# Patient Record
Sex: Female | Born: 2000 | Race: White | Hispanic: No | Marital: Single
Health system: Southern US, Community
[De-identification: ages and names within clinical notes are randomized; demographics above are authoritative.]

## PROBLEM LIST (undated history)

## (undated) VITALS — BP 123/67 | HR 69 | Temp 98.2°F | Resp 17 | Ht 69.0 in | Wt 175.0 lb

## (undated) DIAGNOSIS — F329 Major depressive disorder, single episode, unspecified: Secondary | ICD-10-CM

## (undated) DIAGNOSIS — F909 Attention-deficit hyperactivity disorder, unspecified type: Secondary | ICD-10-CM

## (undated) DIAGNOSIS — F32A Depression, unspecified: Secondary | ICD-10-CM

## (undated) DIAGNOSIS — F913 Oppositional defiant disorder: Secondary | ICD-10-CM

## (undated) DIAGNOSIS — Z9622 Myringotomy tube(s) status: Secondary | ICD-10-CM

## (undated) HISTORY — PX: MYRINGOTOMY: SUR874

---
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
## 2022-05-03 ENCOUNTER — Ambulatory Visit (HOSPITAL_COMMUNITY): Admission: AD | Admit: 2022-05-03 | Payer: Self-pay | Source: Home / Self Care

## 2022-05-03 ENCOUNTER — Encounter (HOSPITAL_COMMUNITY): Payer: Self-pay | Admitting: Psychiatry

## 2022-05-03 ENCOUNTER — Other Ambulatory Visit: Payer: Self-pay

## 2022-05-03 ENCOUNTER — Inpatient Hospital Stay (HOSPITAL_COMMUNITY)
Admission: AD | Admit: 2022-05-03 | Discharge: 2022-05-08 | DRG: 885 | Disposition: A | Discharge status: Home / Self Care | Payer: Medicaid Other | Attending: Psychiatry | Admitting: Psychiatry

## 2022-05-03 DIAGNOSIS — Z9152 Personal history of nonsuicidal self-harm: Secondary | ICD-10-CM | POA: Diagnosis not present

## 2022-05-03 DIAGNOSIS — F909 Attention-deficit hyperactivity disorder, unspecified type: Secondary | ICD-10-CM | POA: Diagnosis present

## 2022-05-03 DIAGNOSIS — R45851 Suicidal ideations: Secondary | ICD-10-CM | POA: Diagnosis present

## 2022-05-03 DIAGNOSIS — J45909 Unspecified asthma, uncomplicated: Secondary | ICD-10-CM | POA: Diagnosis present

## 2022-05-03 DIAGNOSIS — F419 Anxiety disorder, unspecified: Secondary | ICD-10-CM

## 2022-05-03 DIAGNOSIS — Z20822 Contact with and (suspected) exposure to covid-19: Secondary | ICD-10-CM | POA: Diagnosis present

## 2022-05-03 DIAGNOSIS — F32A Depression, unspecified: Secondary | ICD-10-CM

## 2022-05-03 DIAGNOSIS — F333 Major depressive disorder, recurrent, severe with psychotic symptoms: Principal | ICD-10-CM

## 2022-05-03 DIAGNOSIS — F329 Major depressive disorder, single episode, unspecified: Secondary | ICD-10-CM | POA: Insufficient documentation

## 2022-05-03 DIAGNOSIS — G4700 Insomnia, unspecified: Secondary | ICD-10-CM | POA: Diagnosis present

## 2022-05-03 LAB — SARS CORONAVIRUS 2 BY RT PCR: SARS Coronavirus 2 by RT PCR: NEGATIVE

## 2022-05-03 MED ORDER — ALUM & MAG HYDROXIDE-SIMETH 200-200-20 MG/5ML PO SUSP: 30.0000 mL | ORAL | Status: DC | PRN

## 2022-05-03 MED ORDER — ACETAMINOPHEN 325 MG PO TABS: 650.0000 mg | ORAL_TABLET | Freq: Four times a day (QID) | ORAL | Status: DC | PRN

## 2022-05-03 MED ORDER — MAGNESIUM HYDROXIDE 400 MG/5ML PO SUSP: 30.0000 mL | Freq: Every day | ORAL | Status: DC | PRN

## 2022-05-03 MED ORDER — HYDROXYZINE HCL 25 MG PO TABS
25.0000 mg | ORAL_TABLET | Freq: Three times a day (TID) | ORAL | Status: DC | PRN
  Administered 2022-05-07: 25 mg via ORAL
  Filled 2022-05-03: qty 1
  Filled 2022-05-03: qty 10
  Filled 2022-05-03: qty 1

## 2022-05-03 NOTE — Tx Team (Signed)
Initial Treatment Plan 05/03/2022 5:09 PM ROYALE ALFRED [id number]    PATIENT STRESSORS: Educational concerns   Financial difficulties   Health problems     PATIENT STRENGTHS: Hydrographic surveyor for treatment/growth  Supportive family/friends    PATIENT IDENTIFIED PROBLEMS: Suicidal thoughts  Depression  Anxiety  Ineffective coping skills               DISCHARGE CRITERIA:  Adequate post-discharge living arrangements Motivation to continue treatment in a less acute level of care  PRELIMINARY DISCHARGE PLAN: Attend aftercare/continuing care group Attend PHP/IOP Outpatient therapy Return to previous living arrangement  PATIENT/FAMILY INVOLVEMENT: This treatment plan has been presented to and reviewed with the patient, Terry Keith, and/or family member.  The patient and family have been given the opportunity to ask questions and make suggestions.  Vela Prose, RN 05/03/2022, 5:09 PM

## 2022-05-03 NOTE — H&P (Signed)
[place] Screening Exam  Terry Keith is a 21 y.o. female who presents voluntarily as a walk-in accompanied by her mother [name] to [facility] for worsening suicidal thoughts. Pt has past psychiatric hx of ADHD, Suicide ideation, Homicidal ideation, Major depression, and anxiety. She is currently attending [employer] [facility] [employer], Adult High School Diploma Program. 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  On assessment today, patient is examined sitting up on a chair in the Screen room. Chart reviewed and findings shared with the tx team and discussed with Dr. Dwyane Dee. Alert and oriented x 4 to person, time, place and situation. Speech fluent, clear with normal pattern but with low volume. Appeared depressed, but able to maintains eye contact during the encounter. Thought process coherent and linear. Thought content within normal limit and logical.  Reported depressed and anxious mood, appropriate and congruent affect.  Reported Suicidal thoughts has been persistent for the past 3 months with plans to OD on medications and to drink chloride bleach. Reported no particular trigger. Reported anxiety of 10/10 with panic attacks on a scale of 0 to 10, 10 being the highest. Reported symptoms of depression as self isolation, crying spells, irritability, hopelessness, worthlessness, guilt, poor concentration, and anhedonia. Reported hx of cutting at age 20 years and that was the first/last time she cut herself and had to get some help. Endorsed SI, and AVH, and failure to take her prescribed medication of Escitalopram for fear of impulsive OD. Endorsed sleeping up to 7 to 8 hours last night and decrease appetite. Denied  HI, paranoia, delusional or history of suicidal attempt.  Denied access to firearms, alcohol, drug or tobacco use or dependence. Reported not being followed by a Therapist or Psychiatrist.  Disposition: Based on my assessment, I recommend  psychiatric Inpatient admission to Adult Unit at [facility]. Covid 19 and admission orders initiated. [facility] Treatment team made aware of patient's disposition.  Total Time spent with patient: 1 hour  Psychiatric Specialty Exam:  Presentation  General Appearance: Appropriate for Environment; Casual; Fairly Groomed  Eye Contact:Fair  Speech:Clear and Coherent  Speech Volume:Normal  Handedness:Right  Mood and Affect  Mood:Anxious; Depressed  Affect:Appropriate; Congruent  Thought Process  Thought Processes:Coherent; Linear  Descriptions of Associations:Intact  Orientation:Full (Time, Place and Person)  Thought Content:Logical; WDL  History of Schizophrenia/Schizoaffective disorder:No data recorded Duration of Psychotic Symptoms:No data recorded Hallucinations:Hallucinations: Auditory; Visual Description of Auditory Hallucinations: Hears non familiar voices that she is useless and worthless Description of Visual Hallucinations: Sees shadows at the corner of her eyes.  Ideas of Reference:None  Suicidal Thoughts:Suicidal Thoughts: Yes, Active SI Active Intent and/or Plan: With Intent; With Plan; With Means to [name]  Homicidal Thoughts:Homicidal Thoughts: No  Sensorium  Memory:Immediate Fair; Recent Fair; Remote Fair  Judgment:Fair  Insight:Fair  Executive Functions  Concentration:Fair  Attention Span:Fair  [place]  Language:Good  Psychomotor Activity  Psychomotor Activity:Psychomotor Activity: Normal  Assets  Assets:Communication Skills; Social Support; Physical Health  Sleep  Sleep:Sleep: Good Number of Hours of Sleep: 8  Physical Exam: Physical Exam Vitals and nursing note reviewed.  Constitutional:      Appearance: Normal appearance.  HENT:     Head: Normocephalic and atraumatic.     Right Ear: External ear normal.     Left Ear: External ear normal.     Nose: Nose normal.     Mouth/Throat:     Mouth: Mucous  membranes are moist.  Pharynx: Oropharynx is clear.  Eyes:     Extraocular Movements: Extraocular movements intact.     Conjunctiva/sclera: Conjunctivae normal.     Pupils: Pupils are equal, round, and reactive to light.  Cardiovascular:     Rate and Rhythm: Normal rate.     Pulses: Normal pulses.  Pulmonary:     Effort: Pulmonary effort is normal.  Abdominal:     Palpations: Abdomen is soft.  Genitourinary:    Comments: Deferred Musculoskeletal:        General: Normal range of motion.     Cervical back: Normal range of motion.  Skin:    General: Skin is warm.  Neurological:     General: No focal deficit present.     Mental Status: She is alert and oriented to person, place, and time.  Psychiatric:        Behavior: Behavior normal.   Review of Systems  Constitutional: Negative.  Negative for chills and fever.  HENT: Negative.  Negative for hearing loss and tinnitus.   Eyes: Negative.  Negative for blurred vision and double vision.  Respiratory: Negative.  Negative for cough, sputum production, shortness of breath and wheezing.   Cardiovascular: Negative.  Negative for chest pain and palpitations.  Gastrointestinal: Negative.  Negative for heartburn and nausea.  Genitourinary: Negative.  Negative for dysuria and urgency.  Musculoskeletal: Negative.  Negative for back pain, falls, joint pain, myalgias and neck pain.  Skin: Negative.  Negative for itching and rash.  Neurological: Negative.  Negative for dizziness, tingling, tremors, sensory change, speech change, focal weakness, seizures, loss of consciousness, weakness and headaches.  Endo/Heme/Allergies: Negative.  Negative for environmental allergies and polydipsia. Does not bruise/bleed easily.  Psychiatric/Behavioral:  Positive for depression, hallucinations and suicidal ideas. The patient is nervous/anxious.   Blood pressure (!) 158/83, pulse 71, temperature 98.8 F (37.1 C), temperature source Oral, resp. rate 18. There  is no height or weight on file to calculate BMI.  Musculoskeletal: Strength & Muscle Tone: within normal limits Gait & Station: normal Patient leans: N/A  Recommendations:  Based on my evaluation the patient appears to have an emergency psychiatric condition for which I recommend the patient be admitted  as an in-patient to [facility].  Laretta Bolster, FNP 05/03/2022, 1:27 PM
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
## 2022-05-03 NOTE — Progress Notes (Signed)
Admission Note: Patient is a 21 year old female who presents as a walk-in accompanied by her mother for symptoms of depression, anxiety and suicidal ideation with no plan.  Patient presents with a depressed mood and flat affect.  Patient is alert and oriented x 4.  Stated goal is to learn coping skills.  Admission plan of care reviewed, consent for treatment signed.  Skin assessment completed.  Acne noted all over face and body.  No contraband found.  Patient oriented to the unit, staff and room.  Routine safety checks initiated.  Verbalizes understanding of unit rules/protocols.  Patient is safe on the unit.

## 2022-05-04 ENCOUNTER — Encounter (HOSPITAL_COMMUNITY): Payer: Self-pay

## 2022-05-04 DIAGNOSIS — F333 Major depressive disorder, recurrent, severe with psychotic symptoms: Secondary | ICD-10-CM

## 2022-05-04 LAB — COMPREHENSIVE METABOLIC PANEL
ALT: 14 U/L (ref 0–44)
AST: 16 U/L (ref 15–41)
Albumin: 4.2 g/dL (ref 3.5–5.0)
Alkaline Phosphatase: 55 U/L (ref 38–126)
Anion gap: 6 (ref 5–15)
BUN: 15 mg/dL (ref 6–20)
CO2: 27 mmol/L (ref 22–32)
Calcium: 9.2 mg/dL (ref 8.9–10.3)
Chloride: 107 mmol/L (ref 98–111)
Creatinine, Ser: 0.6 mg/dL (ref 0.44–1.00)
GFR, Estimated: >60 mL/min (ref >60)
Glucose, Bld: 93 mg/dL (ref 70–99)
Potassium: 3.5 mmol/L (ref 3.5–5.1)
Sodium: 140 mmol/L (ref 135–145)
Total Bilirubin: 0.8 mg/dL (ref 0.3–1.2)
Total Protein: 7.4 g/dL (ref 6.5–8.1)

## 2022-05-04 LAB — CBC
HCT: 40 % (ref 36.0–46.0)
Hemoglobin: 13.3 g/dL (ref 12.0–15.0)
MCH: 30.7 pg (ref 26.0–34.0)
MCHC: 33.3 g/dL (ref 30.0–36.0)
MCV: 92.4 fL (ref 80.0–100.0)
Platelets: 348 10*3/uL (ref 150–400)
RBC: 4.33 MIL/uL (ref 3.87–5.11)
RDW: 12.3 % (ref 11.5–15.5)
WBC: 5.6 10*3/uL (ref 4.0–10.5)
nRBC: 0 % (ref 0.0–0.2)

## 2022-05-04 LAB — TSH: TSH: 1.148 u[IU]/mL (ref 0.350–4.500)

## 2022-05-04 LAB — HEMOGLOBIN A1C
Hgb A1c MFr Bld: 5 % (ref 4.8–5.6)
Mean Plasma Glucose: 96.8 mg/dL

## 2022-05-04 MED ORDER — QUETIAPINE FUMARATE 50 MG PO TABS
50.0000 mg | ORAL_TABLET | Freq: Every day | ORAL | Status: DC
  Administered 2022-05-04 – 2022-05-07 (×4): 50 mg via ORAL
  Filled 2022-05-04: qty 7
  Filled 2022-05-04 (×5): qty 1

## 2022-05-04 MED ORDER — FLUOXETINE HCL 10 MG PO CAPS
10.0000 mg | ORAL_CAPSULE | Freq: Every day | ORAL | Status: DC
  Administered 2022-05-04 – 2022-05-08 (×5): 10 mg via ORAL
  Filled 2022-05-04 (×4): qty 1
  Filled 2022-05-04: qty 7
  Filled 2022-05-04 (×3): qty 1

## 2022-05-04 NOTE — Progress Notes (Signed)
   05/04/22 0800  Psych Admission Type (Psych Patients Only)  Admission Status Voluntary  Psychosocial Assessment  Patient Complaints Depression;Anxiety  Eye Contact Brief  Facial Expression Sad  Affect Depressed  Speech Logical/coherent  Interaction Guarded  Motor Activity Slow  Appearance/Hygiene Unremarkable  Behavior Characteristics Appropriate to situation  Mood Depressed  Thought Process  Coherency WDL  Content WDL  Delusions None reported or observed  Perception WDL  Hallucination None reported or observed  Judgment WDL  Confusion None  Danger to Self  Current suicidal ideation? Denies  Danger to Others  Danger to Others None reported or observed   Pt presents with flat affect and she is guarded on approach.  Pt denies SI/HI/AVH.  Pt took medications without incident and no adverse reactions were noted.  RN will continue to monitor pt's progress and provide support as needed.
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
## 2022-05-04 NOTE — Progress Notes (Signed)
   05/03/22 2100  Psych Admission Type (Psych Patients Only)  Admission Status Voluntary  Psychosocial Assessment  Patient Complaints Anxiety  Eye Contact Fair  Facial Expression Sad;Flat  Affect Depressed  Speech Logical/coherent  Interaction Forwards little  Motor Activity Slow  Appearance/Hygiene Unremarkable  Behavior Characteristics Appropriate to situation;Cooperative  Mood Depressed  Thought Process  Coherency WDL  Content WDL  Delusions None reported or observed  Perception WDL  Hallucination None reported or observed  Judgment WDL  Confusion None  Danger to Self  Current suicidal ideation? Denies  Danger to Others  Danger to Others None reported or observed
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
## 2022-05-04 NOTE — Group Note (Addendum)
[facility] LCSW Group Therapy Note   Group Date: 05/04/2022 Start Time: 1300 End Time: 1400  Type of Therapy and Topic:  Group Therapy:  Feelings around Relapse and Recovery  Participation Level:  Minimal   Mood: Euthymic   Description of Group:    Patients in this group will discuss emotions they experience before and after a relapse. They will process how experiencing these feelings, or avoidance of experiencing them, relates to having a relapse. Facilitator will guide patients to explore emotions they have related to recovery. Patients will be encouraged to process which emotions are more powerful. They will be guided to discuss the emotional reaction significant others in their lives may have to patients' relapse or recovery. Patients will be assisted in exploring ways to respond to the emotions of others without this contributing to a relapse.  Therapeutic Goals: Patient will identify two or more emotions that lead to relapse for them:  Patient will identify two emotions that result when they relapse:  Patient will identify two emotions related to recovery:  Patient will demonstrate ability to communicate their needs through discussion and/or role plays.   Summary of Patient Progress:  Patient was present for the entirety of group session. Patient participated in opening and closing remarks. However, patient did not contribute at all to the topic of discussion despite encouraged participation.    Therapeutic Modalities:   Cognitive Behavioral Therapy Solution-Focused Therapy Assertiveness Training Relapse Prevention Therapy   Corky Crafts, [place]

## 2022-05-04 NOTE — BH IP Treatment Plan (Signed)
Interdisciplinary Treatment and Diagnostic Plan Update  05/04/2022 Time of Session: 9:30am Terry Keith MRN: [id number]  Principal Diagnosis: <principal problem not specified>  Secondary Diagnoses: Active Problems:   * No active hospital problems. *   Current Medications:  Current Facility-Administered Medications  Medication Dose Route Frequency Provider Last Rate Last Admin   acetaminophen (TYLENOL) tablet 650 mg  650 mg Oral Q6H PRN Ntuen, Kris Hartmann, FNP       alum & mag hydroxide-simeth (MAALOX/MYLANTA) 200-200-20 MG/5ML suspension 30 mL  30 mL Oral Q4H PRN Ntuen, Kris Hartmann, FNP       FLUoxetine (PROZAC) capsule 10 mg  10 mg Oral Daily Pashayan, Redgie Grayer, MD       hydrOXYzine (ATARAX) tablet 25 mg  25 mg Oral TID PRN Ntuen, Kris Hartmann, FNP       magnesium hydroxide (MILK OF MAGNESIA) suspension 30 mL  30 mL Oral Daily PRN Ntuen, Kris Hartmann, FNP       QUEtiapine (SEROQUEL) tablet 50 mg  50 mg Oral QHS Pashayan, Redgie Grayer, MD       PTA Medications: Medications Prior to Admission  Medication Sig Dispense Refill Last Dose   escitalopram (LEXAPRO) 10 MG tablet Take 10 mg by mouth daily.   05/02/2022   albuterol (PROVENTIL HFA;VENTOLIN HFA) 108 (90 BASE) MCG/ACT inhaler Inhale 2 puffs into the lungs every 4 (four) hours as needed for wheezing or shortness of breath.       Patient Stressors: Educational Stage manager difficulties   Health problems    Patient Strengths: Hydrographic surveyor for treatment/growth  Supportive family/friends   Treatment Modalities: Medication Management, Group therapy, Case management,  1 to 1 session with clinician, Psychoeducation, Recreational therapy.   Physician Treatment Plan for Primary Diagnosis: <principal problem not specified> Long Term Goal(s):     Short Term Goals:    Medication Management: Evaluate patient's response, side effects, and tolerance of medication regimen.  Therapeutic Interventions: 1 to 1 sessions, Unit  Group sessions and Medication administration.  Evaluation of Outcomes: Not Met  Physician Treatment Plan for Secondary Diagnosis: Active Problems:   * No active hospital problems. *  Long Term Goal(s):     Short Term Goals:       Medication Management: Evaluate patient's response, side effects, and tolerance of medication regimen.  Therapeutic Interventions: 1 to 1 sessions, Unit Group sessions and Medication administration.  Evaluation of Outcomes: Not Met   RN Treatment Plan for Primary Diagnosis: <principal problem not specified> Long Term Goal(s): Knowledge of disease and therapeutic regimen to maintain health will improve  Short Term Goals: Ability to remain free from injury will improve, Ability to verbalize frustration and anger appropriately will improve, Ability to demonstrate self-control, Ability to participate in decision making will improve, Ability to verbalize feelings will improve, Ability to disclose and discuss suicidal ideas, Ability to identify and develop effective coping behaviors will improve, and Compliance with prescribed medications will improve  Medication Management: RN will administer medications as ordered by provider, will assess and evaluate patient's response and provide education to patient for prescribed medication. RN will report any adverse and/or side effects to prescribing provider.  Therapeutic Interventions: 1 on 1 counseling sessions, Psychoeducation, Medication administration, Evaluate responses to treatment, Monitor vital signs and CBGs as ordered, Perform/monitor CIWA, COWS, AIMS and Fall Risk screenings as ordered, Perform wound care treatments as ordered.  Evaluation of Outcomes: Not Met   LCSW Treatment Plan for Primary Diagnosis: <principal problem  not specified> Long Term Goal(s): Safe transition to appropriate next level of care at discharge, Engage patient in therapeutic group addressing interpersonal concerns.  Short Term Goals:  Engage patient in aftercare planning with referrals and resources, Increase social support, Increase ability to appropriately verbalize feelings, Increase emotional regulation, Facilitate acceptance of mental health diagnosis and concerns, Facilitate patient progression through stages of change regarding substance use diagnoses and concerns, Identify triggers associated with mental health/substance abuse issues, and Increase skills for wellness and recovery  Therapeutic Interventions: Assess for all discharge needs, 1 to 1 time with Social worker, Explore available resources and support systems, Assess for adequacy in community support network, Educate family and significant other(s) on suicide prevention, Complete Psychosocial Assessment, Interpersonal group therapy.  Evaluation of Outcomes: Not Met   Progress in Treatment: Attending groups: No. Participating in groups: No. Taking medication as prescribed: Yes. Toleration medication: Yes. Family/Significant other contact made: No, will contact:  CSW will assess and identify a support Patient understands diagnosis: Yes. Discussing patient identified problems/goals with staff: Yes. Medical problems stabilized or resolved: Yes. Denies suicidal/homicidal ideation: Yes. Issues/concerns per patient self-inventory: No. Other: None  New problem(s) identified: No, Describe:  none  New Short Term/Long Term Goal(s):   medication stabilization, elimination of SI thoughts, development of comprehensive mental wellness plan.    Patient Goals:  Patient states, " I want to get to a point where I don't have suicidal thoughts or hear voices."  Discharge Plan or Barriers:  Patient recently admitted. CSW will continue to follow and assess for appropriate referrals and possible discharge planning.    Reason for Continuation of Hospitalization: Depression Hallucinations Medication stabilization Suicidal ideation  Estimated Length of Stay: 3-5  days  Last 3 [place] Suicide Severity Risk Score: Red Level Admission (Current) from OP Visit from 05/03/2022 in [facility]  C-SSRS RISK CATEGORY Low Risk       Last PHQ 2/9 Scores:     View : No data to display.          Scribe for Treatment Team: Zachery Conch, LCSW 05/04/2022 10:45 AM

## 2022-05-04 NOTE — Progress Notes (Signed)
Adult Psychoeducational Group Note  Date:  05/04/2022 Time:  8:30 PM  Group Topic/Focus:  Wrap-Up Group:   The focus of this group is to help patients review their daily goal of treatment and discuss progress on daily workbooks.  Participation Level:  Did Not Attend  Participation Quality:  Did Not Attend  Affect:  Did Not Attend  Cognitive:  Did Not Attend  Insight: Did Not Attend  Engagement in Group:  Did Not Attend  Modes of Intervention:  Did Not Attend  Additional Comments:   Pt was encouraged to attend wrap up group but refused   Vevelyn Pat 05/04/2022, 8:30 PM

## 2022-05-04 NOTE — Progress Notes (Signed)
   05/04/22 2130  Psych Admission Type (Psych Patients Only)  Admission Status Voluntary  Psychosocial Assessment  Patient Complaints Depression  Eye Contact Brief  Facial Expression Flat  Affect Appropriate to circumstance  Speech Logical/coherent  Interaction Cautious  Motor Activity Other (Comment) (WDL)  Appearance/Hygiene Unremarkable  Behavior Characteristics Appropriate to situation  Mood Depressed  Thought Process  Coherency WDL  Content WDL  Delusions None reported or observed  Perception WDL  Hallucination None reported or observed  Judgment Poor  Confusion None  Danger to Self  Current suicidal ideation? Denies  Danger to Others  Danger to Others None reported or observed
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
## 2022-05-05 LAB — LIPID PANEL
Cholesterol: 145 mg/dL (ref 0–200)
HDL: 44 mg/dL (ref >40)
LDL Cholesterol: 90 mg/dL (ref 0–99)
Total CHOL/HDL Ratio: 3.3 RATIO
Triglycerides: 55 mg/dL (ref <150)
VLDL: 11 mg/dL (ref 0–40)

## 2022-05-05 LAB — PROLACTIN: Prolactin: 28.5 ng/mL — ABNORMAL HIGH (ref 4.8–23.3)

## 2022-05-05 NOTE — Progress Notes (Signed)
Adult Psychoeducational Group Note  Date:  05/05/2022 Time:  7:47 PM  Group Topic/Focus:  Wrap-Up Group:   The focus of this group is to help patients review their daily goal of treatment and discuss progress on daily workbooks.  Participation Level:  Active  Participation Quality:  Appropriate  Affect:  Appropriate  Cognitive:  Appropriate  Insight: Appropriate  Engagement in Group:  Engaged  Modes of Intervention:  Discussion  Additional Comments:  Pt states goal is to get rid oc suicidal thoughts so she is able to live out her daily life. Pt states coping skills include her passion for drawing and painting as well as cooking.  Terry Keith Terry Keith 05/05/2022, 7:47 PM

## 2022-05-05 NOTE — Progress Notes (Signed)
D. Pt presented with a flat affect/ quiet upon initial approach. Pt reported that she slept well last night, endorsed having a 'fair' appetite, 'low' energy level, and described her concentration as 'good'. Per pt's self inventory, pt rated her depression, hopelessness and anxiety a 3/2/5, respectively. Pt wrote that her goal was to work on her "anxiety" by using coping skills. Pt denies SI/HI and AVH  A. Labs and vitals monitored. Pt given and educated on medications. Pt supported emotionally and encouraged to express concerns and ask questions.   R. Pt remains safe with 15 minute checks. Will continue POC.

## 2022-05-05 NOTE — Progress Notes (Signed)
   05/05/22 2136  Psych Admission Type (Psych Patients Only)  Admission Status Voluntary  Psychosocial Assessment  Patient Complaints Anxiety  Eye Contact Fair  Facial Expression Flat  Affect Appropriate to circumstance  Speech Logical/coherent  Interaction Minimal  Motor Activity Other (Comment) (WDL)  Appearance/Hygiene Unremarkable  Behavior Characteristics Appropriate to situation  Mood Depressed  Thought Process  Coherency WDL  Content WDL  Delusions None reported or observed  Perception WDL  Hallucination None reported or observed  Judgment Poor  Confusion None  Danger to Self  Current suicidal ideation? Denies  Danger to Others  Danger to Others None reported or observed

## 2022-05-05 NOTE — Group Note (Signed)
[facility] LCSW Group Therapy Note   Type of Therapy and Topic:  Group Therapy:  Healthy vs Unhealthy Coping Skills  Participation Level:  Active   Description of Group:  The focus of this group was to determine what unhealthy and healthy coping techniques typically are used by group members and why they tend to fall back on those particular techniques of handling hard situations. Patients were guided in becoming aware of the differences between healthy and unhealthy coping techniques.  Facilitator led a discussion about the benefits and costs of returning to old unhealthy coping techniques, as well as the benefits and costs of learning new healthier coping skills.  Therapeutic Goals Patients learned that coping is what human beings do all day long to deal with various situations in their lives Patients defined and discussed healthy vs unhealthy coping techniques Patients came to understand the the reasons human beings often return to old coping techniques that they know are unhelpful Patients were provided with motivation to consider new means of coping that may be more healthy and helpful Patients provided support and ideas to each other  Summary of Patient Progress: During group, patient expressed that her unhealthy coping skill is often drinking a lot of sugary soda, while the healthy choice is drawing.  Additionally, she is willing to add puzzles as a means to focus and to distract herself.  She participated fully throughout group.   Therapeutic Modalities Psychoeducation Motivational Interviewing   Ambrose Mantle, LCSW 05/05/2022, 3:56 PM   er

## 2022-05-05 NOTE — Progress Notes (Signed)
[facility] MD Progress Note  05/05/2022 3:24 PM Terry Keith  MRN:  [id number] Subjective:   Terry Keith is a 21 yr old female who presented on 6/1 to [facility] for worsening SI and depression, she was admitted to [facility] on 6/2.  PPHx is significant for Depression, Anxiety, and ADHD and a remote history of Self-Injurious Behavior (Cutting first and last time years ago).  Overnight patient had no behavior concerns. Patient also has been compliant with her medications and has not required PRNs.   On assessment this AM patient reports that she is tired, but she feels she slept ok. On a scale 0-10 , with 10 being the worse option patient ranks her depression a 3/10 and her anxiety a 5/10. Patient reports that she is not sure how to lower her anxiety but report she is still having trouble talking in groups and she is working on this. Patient denies having any adverse side effects from her medications. Patient reports that she is not having any more AVH and endorses that this is an improvement for her. Patient denies SI and HI on assessment today.   Principal Problem: MDD (major depressive disorder), recurrent, severe, with psychosis ([place]) Diagnosis: Principal Problem:   MDD (major depressive disorder), recurrent, severe, with psychosis ([place])  Total Time spent with patient: 20 minutes  Past Psychiatric History: See H&P  Past Medical History:  Past Medical History:  Diagnosis Date   ADHD    Asthma    Attention deficit disorder    Depression    History of placement of ear tubes    ODD (oppositional defiant disorder)     Past Surgical History:  Procedure Laterality Date   MYRINGOTOMY     Family History:  Family History  Problem Relation Age of Onset   Cancer Other    Diabetes Other    Hypertension Other    Thyroid disease Other    Family Psychiatric  History: See H&P Social History:  Social History   Substance and Sexual Activity  Alcohol Use No   Comment: pt is 21yo     Social History    Substance and Sexual Activity  Drug Use No    Social History   Socioeconomic History   Marital status: Single    Spouse name: Not on file   Number of children: Not on file   Years of education: Not on file   Highest education level: Not on file  Occupational History   Not on file  Tobacco Use   Smoking status: Never    Passive exposure: Yes   Smokeless tobacco: Never  Substance and Sexual Activity   Alcohol use: No    Comment: pt is 21yo   Drug use: No   Sexual activity: Never  Other Topics Concern   Not on file  Social History Narrative   Not on file   Social Determinants of Health   Financial Resource Strain: Not on file  Food Insecurity: Not on file  Transportation Needs: Not on file  Physical Activity: Not on file  Stress: Not on file  Social Connections: Not on file   Additional Social History:    Pain Medications: SEE MAR Prescriptions: SEE MAR Over the Counter: SEE MAR History of alcohol / drug use?: No history of alcohol / drug abuse                    Sleep: Fair  Appetite:  Good  Current Medications: Current Facility-Administered Medications  Medication  Dose Route Frequency Provider Last Rate Last Admin   acetaminophen (TYLENOL) tablet 650 mg  650 mg Oral Q6H PRN Ntuen, Kris Hartmann, FNP       alum & mag hydroxide-simeth (MAALOX/MYLANTA) 200-200-20 MG/5ML suspension 30 mL  30 mL Oral Q4H PRN Ntuen, Kris Hartmann, FNP       FLUoxetine (PROZAC) capsule 10 mg  10 mg Oral Daily Briant Cedar, MD   10 mg at 05/05/22 [id number]   hydrOXYzine (ATARAX) tablet 25 mg  25 mg Oral TID PRN Laretta Bolster, FNP       magnesium hydroxide (MILK OF MAGNESIA) suspension 30 mL  30 mL Oral Daily PRN Ntuen, Kris Hartmann, FNP       QUEtiapine (SEROQUEL) tablet 50 mg  50 mg Oral QHS Briant Cedar, MD   50 mg at 05/04/22 2141    Lab Results:  Results for orders placed or performed during the hospital encounter of 05/03/22 (from the past 48 hour(s))  CBC     Status: None    Collection Time: 05/04/22  6:56 AM  Result Value Ref Range   WBC 5.6 4.0 - 10.5 K/uL   RBC 4.33 3.87 - 5.11 MIL/uL   Hemoglobin 13.3 12.0 - 15.0 g/dL   HCT 40.0 36.0 - 46.0 %   MCV 92.4 80.0 - 100.0 fL   MCH 30.7 26.0 - 34.0 pg   MCHC 33.3 30.0 - 36.0 g/dL   RDW 12.3 11.5 - 15.5 %   Platelets 348 150 - 400 K/uL   nRBC 0.0 0.0 - 0.2 %    Comment: Performed at [facility], [place] [address] [place], [place] [address]  Comprehensive metabolic panel     Status: None   Collection Time: 05/04/22  6:56 AM  Result Value Ref Range   Sodium 140 135 - 145 mmol/L   Potassium 3.5 3.5 - 5.1 mmol/L   Chloride 107 98 - 111 mmol/L   CO2 27 22 - 32 mmol/L   Glucose, Bld 93 70 - 99 mg/dL    Comment: Glucose reference range applies only to samples taken after fasting for at least 8 hours.   BUN 15 6 - 20 mg/dL   Creatinine, Ser 0.60 0.44 - 1.00 mg/dL   Calcium 9.2 8.9 - 10.3 mg/dL   Total Protein 7.4 6.5 - 8.1 g/dL   Albumin 4.2 3.5 - 5.0 g/dL   AST 16 15 - 41 U/L   ALT 14 0 - 44 U/L   Alkaline Phosphatase 55 38 - 126 U/L   Total Bilirubin 0.8 0.3 - 1.2 mg/dL   GFR, Estimated >60 >60 mL/min    Comment: (NOTE) Calculated using the CKD-EPI Creatinine Equation (2021)    Anion gap 6 5 - 15    Comment: Performed at [facility], [place] [address] [place], [place] [address]  Hemoglobin A1c     Status: None   Collection Time: 05/04/22  6:56 AM  Result Value Ref Range   Hgb A1c MFr Bld 5.0 4.8 - 5.6 %    Comment: (NOTE) Pre diabetes:          5.7%-6.4%  Diabetes:              >6.4%  Glycemic control for   <7.0% adults with diabetes    Mean Plasma Glucose 96.8 mg/dL    Comment: Performed at [facility] [address], [place], [place] [address]  TSH     Status:  None   Collection Time: 05/04/22  6:56 AM  Result Value Ref Range   TSH 1.148 0.350 - 4.500 uIU/mL    Comment: Performed by a 3rd Generation assay with a functional sensitivity of <=0.01  uIU/mL. Performed at [facility], [address], [place], [place] [address]   Lipid panel     Status: None   Collection Time: 05/05/22  6:37 AM  Result Value Ref Range   Cholesterol 145 0 - 200 mg/dL   Triglycerides 55 <774 mg/dL   HDL 44 >14 mg/dL   Total CHOL/HDL Ratio 3.3 RATIO   VLDL 11 0 - 40 mg/dL   LDL Cholesterol 90 0 - 99 mg/dL    Comment:        Total Cholesterol/HDL:CHD Risk Coronary Heart Disease Risk Table                     Men   Women  1/2 Average Risk   3.4   3.3  Average Risk       5.0   4.4  2 X Average Risk   9.6   7.1  3 X Average Risk  23.4   11.0        Use the calculated Patient Ratio above and the CHD Risk Table to determine the patient's CHD Risk.        ATP III CLASSIFICATION (LDL):  <100     mg/dL   Optimal  239-532  mg/dL   Near or Above                    Optimal  130-159  mg/dL   Borderline  023-343  mg/dL   High  >568     mg/dL   Very High Performed at [facility], [address] [place], [place] [address]     Blood Alcohol level:  Lab Results  Component Value Date   [facility] <11 08/04/2014    Metabolic Disorder Labs: Lab Results  Component Value Date   HGBA1C 5.0 05/04/2022   MPG 96.8 05/04/2022   No results found for: PROLACTIN Lab Results  Component Value Date   CHOL 145 05/05/2022   TRIG 55 05/05/2022   HDL 44 05/05/2022   CHOLHDL 3.3 05/05/2022   VLDL 11 05/05/2022   LDLCALC 90 05/05/2022    Physical Findings: AIMS: Facial and Oral Movements Muscles of Facial Expression: None, normal Lips and Perioral Area: None, normal Jaw: None, normal Tongue: None, normal,Extremity Movements Upper (arms, wrists, hands, fingers): None, normal Lower (legs, knees, ankles, toes): None, normal, Trunk Movements Neck, shoulders, hips: None, normal, Overall Severity Severity of abnormal movements (highest score from questions above): None, normal Incapacitation due to abnormal movements: None,  normal Patient's awareness of abnormal movements (rate only patient's report): No Awareness, Dental Status Current problems with teeth and/or dentures?: No Does patient usually wear dentures?: No  CIWA:    COWS:     Musculoskeletal: Strength & Muscle Tone: within normal limits Gait & Station: normal Patient leans: N/A  Psychiatric Specialty Exam:  Presentation  General Appearance: Appropriate for Environment; Casual  Eye Contact:Good  Speech:Clear and Coherent  Speech Volume:Normal  Handedness:Right   Mood and Affect  Mood:-- ("tired" describes a bit of anxiety)  Affect:Flat   Thought Process  Thought Processes:Coherent  Descriptions of Associations:Intact  Orientation:Full (Time, Place and Person)  Thought Content:Logical  History of Schizophrenia/Schizoaffective disorder:No  Duration of Psychotic Symptoms:No data recorded Hallucinations:Hallucinations: None  Ideas of Reference:None  Suicidal Thoughts:Suicidal Thoughts: No  Homicidal Thoughts:Homicidal Thoughts: No   Sensorium  Memory:Immediate Fair; Recent Fair  Judgment:-- (Improving)  Insight:-- (Improving)   Executive Functions  Concentration:Fair  Attention Span:Fair  [place]   Psychomotor Activity  Psychomotor Activity:Psychomotor Activity: Decreased   Assets  Assets:Communication Skills; Desire for Improvement; Resilience; Housing; Social Support   Sleep  Sleep:Sleep: Good Number of Hours of Sleep: 7.75    Physical Exam: Physical Exam HENT:     Head: Normocephalic and atraumatic.  Pulmonary:     Effort: Pulmonary effort is normal.  Neurological:     Mental Status: She is alert and oriented to person, place, and time.   Review of Systems  Gastrointestinal:  Negative for abdominal pain and diarrhea.  Psychiatric/Behavioral:  Negative for hallucinations and suicidal ideas. The patient is nervous/anxious. The patient does  not have insomnia.   Blood pressure 113/65, pulse 81, temperature 97.6 F (36.4 C), temperature source Oral, resp. rate 18, height 5\' 9"  (1.753 m), weight 79.4 kg, SpO2 100 %. Body mass index is 25.84 kg/m.   Treatment Plan Summary: Daily contact with patient to assess and evaluate symptoms and progress in treatment and Medication management Terry Keith is a 21 yr old female who presented on 6/1 to [facility] for worsening SI and depression, she was admitted to [facility] on 6/2.  PPHx is significant for Depression, Anxiety, and ADHD and a remote history of Self-Injurious Behavior (Cutting first and last time years ago). Patient appears to be improving with her medication changes will continue to monitor. Patient denies AVH thus far.   Labs: TSH- WNL, A1c- WNL, CMP- WNL, CBC- WNL, Lipid- WNL, Prolactin - pending   MDD, Recurrent, Severe, w/ Psychosis: -Continue Prozac 10 mg daily for depression (R/b/se discussed agreeable with trial) -Continue Seroquel 50 mg QHS for augmentation and psychosis (R/b/se discussed agreeable with trial)     -Continue PRN's: Tylenol, Maalox, Atarax, Milk of Magnesia  PGY-2 Freida Busman, MD 05/05/2022, 3:24 PM

## 2022-05-06 MED ORDER — ALBUTEROL SULFATE HFA 108 (90 BASE) MCG/ACT IN AERS: 2.0000 | INHALATION_SPRAY | RESPIRATORY_TRACT | Status: DC | PRN

## 2022-05-06 MED ORDER — ALBUTEROL SULFATE HFA 108 (90 BASE) MCG/ACT IN AERS
INHALATION_SPRAY | RESPIRATORY_TRACT | Status: AC
  Administered 2022-05-06: 2 via RESPIRATORY_TRACT
  Filled 2022-05-06: qty 6.7

## 2022-05-06 NOTE — Progress Notes (Addendum)
   D. Pt presented with a brighter affect this am- pt has been visible in the milieu interacting appropriately with peers and staff, and observed attending group. Per pt's self inventory, pt rated her depression, hopelessness and anxiety a 1/0/2, respectively. Pt reported that she slept well , and endorsed having a good appetite. Pt wrote that her goal was to work on making a list of "coping skills to prevent anxiety , panic attacks, etc."  Pt currently denies SI/HI and AVH   A. Labs and vitals monitored. Pt given and educated on medications. Pt supported emotionally and encouraged to express concerns and ask questions.   R. Pt remains safe with 15 minute checks. Will continue POC.

## 2022-05-06 NOTE — Progress Notes (Signed)
   05/06/22 2050  Psych Admission Type (Psych Patients Only)  Admission Status Voluntary  Psychosocial Assessment  Patient Complaints None  Eye Contact Fair  Facial Expression Animated  Affect Appropriate to circumstance  Speech Logical/coherent  Interaction Assertive  Motor Activity Other (Comment) (WDL)  Appearance/Hygiene Unremarkable  Behavior Characteristics Appropriate to situation  Mood Pleasant (appropriate to circumstance)  Thought Process  Coherency WDL  Content WDL  Delusions None reported or observed  Perception WDL  Hallucination None reported or observed  Judgment WDL  Confusion None  Danger to Self  Current suicidal ideation? Denies  Danger to Others  Danger to Others None reported or observed

## 2022-05-06 NOTE — Group Note (Signed)
[facility] LCSW Group Therapy Note  05/06/2022    Type of Therapy and Topic:  Group Therapy:  Adding Supports Including Yourself  Participation Level:  Minimal   Description of Group:   Patients in this group were introduced to the concept that additional supports including self-support are an essential part of recovery.  Patients listed their current healthy and unhealthy supports, and discussed the difference between the two.   Several songs were played and a group discussion ensued in which patients stated they could relate to the songs which inspired them to realize they have be willing to help themselves in order to succeed, because other people cannot achieve sobriety or stability for them.  Parents were encouraged toward self-advocacy and self-support as part of their recovery.  They discussed their reactions to these songs' messages, which were positive and hopeful.  Before group ended, they identified the supports they believe they need to add to their lives to achieve their goals at discharge.   Therapeutic Goals: 1)  explain the difference between healthy and unhealthy supports and discuss what specific supports are currently in patients' lives 2)  demonstrate the importance of being a key part of one's own support system 3)  discuss the need for appropriate boundaries with supports 4)  elicit ideas from patients about supports that need to be added in order to achieve goals   Summary of Patient Progress:   The patient expressed that one thing she can do for healthy support is to show paintings she has done to her mother and brother to get feedback from them.  One thing that she finds very unhealthy is to talk to someone who does not want to understand.  Therapeutic Modalities:   Motivational Interviewing Activity  Lynnell Chad

## 2022-05-06 NOTE — Hospital Course (Signed)
Terry Keith is a 21 yr old female who presented on 6/1 to [facility] for worsening SI and depression, she was admitted to [facility] on 6/2.  PPHx is significant for Depression, Anxiety, and ADHD and a remote history of Self-Injurious Behavior (Cutting first and last time years ago). Patient is on Prozac 10mg  and Seroquel 50mg  (endorsed AVH on admission), patient is doing well. NO SI, HI, AVH in 48h and is going to group and talking to others. Likely d'c 6/6.

## 2022-05-06 NOTE — Progress Notes (Signed)
[facility] MD Progress Note  05/06/2022 10:38 AM RAJANEE WENCES  MRN:  [id number] Subjective:  Ardell Farnworth is a 21 yr old female who presented on 6/1 to [facility] for worsening SI and depression, she was admitted to [facility] on 6/2.  PPHx is significant for Depression, Anxiety, and ADHD and a remote history of Self-Injurious Behavior (Cutting first and last time years ago).   Overnight patient had no behavior concerns. Patient also has been compliant with her medications and has not required PRNs.   On assessment this AM, patient was up and in the dayroom early, talking with another patient. Per RN patient appeared to have brighter affect. Patient reports that she slept better last night and is feeling more well rested. Patient reports she has been attending groups and is proud of herself for participating. Patient report she is also proud of her self for talking to other patient's on the unit and she is now less anxious. Patient reports she would rate her anxiety a 2/10 today, and her depression an 1/10. Patient reports that she has also been talking to her mother and the conversation went well. Patient report she will probably go with her mother or brother at discharge. Patient reports that she does not believe she is having negative side effects from her medication. Patient denies SI, HI, and AVH.    Objectively, patient appeared to have a brighter affect and more calm.    Principal Problem: MDD (major depressive disorder), recurrent, severe, with psychosis ([place]) Diagnosis: Principal Problem:   MDD (major depressive disorder), recurrent, severe, with psychosis ([place])  Total Time spent with patient: 20 minutes  Past Psychiatric History: See H&P  Past Medical History:  Past Medical History:  Diagnosis Date   ADHD    Asthma    Attention deficit disorder    Depression    History of placement of ear tubes    ODD (oppositional defiant disorder)     Past Surgical History:  Procedure Laterality Date    MYRINGOTOMY     Family History:  Family History  Problem Relation Age of Onset   Cancer Other    Diabetes Other    Hypertension Other    Thyroid disease Other    Family Psychiatric  History:  See H&P Social History:  Social History   Substance and Sexual Activity  Alcohol Use No   Comment: pt is 21yo     Social History   Substance and Sexual Activity  Drug Use No    Social History   Socioeconomic History   Marital status: Single    Spouse name: Not on file   Number of children: Not on file   Years of education: Not on file   Highest education level: Not on file  Occupational History   Not on file  Tobacco Use   Smoking status: Never    Passive exposure: Yes   Smokeless tobacco: Never  Substance and Sexual Activity   Alcohol use: No    Comment: pt is 21yo   Drug use: No   Sexual activity: Never  Other Topics Concern   Not on file  Social History Narrative   Not on file   Social Determinants of Health   Financial Resource Strain: Not on file  Food Insecurity: Not on file  Transportation Needs: Not on file  Physical Activity: Not on file  Stress: Not on file  Social Connections: Not on file   Additional Social History:    Pain Medications: SEE MAR  Prescriptions: SEE MAR Over the Counter: SEE MAR History of alcohol / drug use?: No history of alcohol / drug abuse                    Sleep: Good  Appetite:  Fair  Current Medications: Current Facility-Administered Medications  Medication Dose Route Frequency Provider Last Rate Last Admin   acetaminophen (TYLENOL) tablet 650 mg  650 mg Oral Q6H PRN Ntuen, Kris Hartmann, FNP       alum & mag hydroxide-simeth (MAALOX/MYLANTA) 200-200-20 MG/5ML suspension 30 mL  30 mL Oral Q4H PRN Ntuen, Kris Hartmann, FNP       FLUoxetine (PROZAC) capsule 10 mg  10 mg Oral Daily Briant Cedar, MD   10 mg at 05/06/22 0809   hydrOXYzine (ATARAX) tablet 25 mg  25 mg Oral TID PRN Ntuen, Kris Hartmann, FNP       magnesium hydroxide  (MILK OF MAGNESIA) suspension 30 mL  30 mL Oral Daily PRN Ntuen, Kris Hartmann, FNP       QUEtiapine (SEROQUEL) tablet 50 mg  50 mg Oral QHS Briant Cedar, MD   50 mg at 05/05/22 2104    Lab Results:  Results for orders placed or performed during the hospital encounter of 05/03/22 (from the past 48 hour(s))  Lipid panel     Status: None   Collection Time: 05/05/22  6:37 AM  Result Value Ref Range   Cholesterol 145 0 - 200 mg/dL   Triglycerides 55 <150 mg/dL   HDL 44 >40 mg/dL   Total CHOL/HDL Ratio 3.3 RATIO   VLDL 11 0 - 40 mg/dL   LDL Cholesterol 90 0 - 99 mg/dL    Comment:        Total Cholesterol/HDL:CHD Risk Coronary Heart Disease Risk Table                     Men   Women  1/2 Average Risk   3.4   3.3  Average Risk       5.0   4.4  2 X Average Risk   9.6   7.1  3 X Average Risk  23.4   11.0        Use the calculated Patient Ratio above and the CHD Risk Table to determine the patient's CHD Risk.        ATP III CLASSIFICATION (LDL):  <100     mg/dL   Optimal  100-129  mg/dL   Near or Above                    Optimal  130-159  mg/dL   Borderline  160-189  mg/dL   High  >190     mg/dL   Very High Performed at [facility] [address] [place], [place] [address]     Blood Alcohol level:  Lab Results  Component Value Date   [facility] <11 [id number]    Metabolic Disorder Labs: Lab Results  Component Value Date   HGBA1C 5.0 05/04/2022   MPG 96.8 05/04/2022   Lab Results  Component Value Date   PROLACTIN 28.5 (H) 05/04/2022   Lab Results  Component Value Date   CHOL 145 05/05/2022   TRIG 55 05/05/2022   HDL 44 05/05/2022   CHOLHDL 3.3 05/05/2022   VLDL 11 05/05/2022   LDLCALC 90 05/05/2022    Physical Findings: AIMS: Facial and Oral Movements Muscles of Facial Expression: None, normal Lips and  Perioral Area: None, normal Jaw: None, normal Tongue: None, normal,Extremity Movements Upper (arms, wrists, hands, fingers): None,  normal Lower (legs, knees, ankles, toes): None, normal, Trunk Movements Neck, shoulders, hips: None, normal, Overall Severity Severity of abnormal movements (highest score from questions above): None, normal Incapacitation due to abnormal movements: None, normal Patient's awareness of abnormal movements (rate only patient's report): No Awareness, Dental Status Current problems with teeth and/or dentures?: No Does patient usually wear dentures?: No  CIWA:    COWS:     Musculoskeletal: Strength & Muscle Tone: within normal limits Gait & Station: normal Patient leans: N/A  Psychiatric Specialty Exam:  Presentation  General Appearance: Appropriate for Environment; Casual  Eye Contact:Good  Speech:Clear and Coherent  Speech Volume:Normal  Handedness:Right   Mood and Affect  Mood:Euthymic  Affect:Appropriate; Congruent   Thought Process  Thought Processes:Coherent  Descriptions of Associations:Circumstantial  Orientation:Full (Time, Place and Person)  Thought Content:Logical  History of Schizophrenia/Schizoaffective disorder:No  Duration of Psychotic Symptoms:No data recorded Hallucinations:Hallucinations: None  Ideas of Reference:None  Suicidal Thoughts:Suicidal Thoughts: No  Homicidal Thoughts:Homicidal Thoughts: No   Sensorium  Memory:Immediate Fair; Recent Fair  Judgment:-- (IMproving)  Insight:-- (Improving)   Executive Functions  Concentration:Fair  Attention Span:Fair  [place]   Psychomotor Activity  Psychomotor Activity:Psychomotor Activity: Normal   Assets  Assets:Resilience; Physical Health; Social Support; Desire for Improvement   Sleep  Sleep:Sleep: Good    Physical Exam: Physical Exam HENT:     Head: Normocephalic and atraumatic.  Pulmonary:     Effort: Pulmonary effort is normal.  Neurological:     Mental Status: She is alert. She is disoriented.   Review of Systems   Gastrointestinal:  Negative for diarrhea.  Psychiatric/Behavioral:  Negative for hallucinations and suicidal ideas. The patient does not have insomnia.   Blood pressure (!) 103/46, pulse 81, temperature 98 F (36.7 C), temperature source Oral, resp. rate 18, height 5\' 9"  (1.753 m), weight 79.4 kg, SpO2 100 %. Body mass index is 25.84 kg/m.   Treatment Plan Summary: Daily contact with patient to assess and evaluate symptoms and progress in treatment and Medication management Jaesha Bartolomucci is a 21 yr old female who presented on 6/1 to [facility] for worsening SI and depression. Patient appears to have more affect today, and is less anxious and able to now participate on the unit. Will continue to monitor to evaluate for stability.  Labs: TSH- WNL, A1c- WNL, CMP- WNL, CBC- WNL, Lipid- WNL, Prolactin - 28.5   MDD, Recurrent, Severe, w/ Psychosis: -Continue Prozac 10 mg daily for depression (R/b/se discussed agreeable with trial) -Continue Seroquel 50 mg QHS for augmentation and psychosis (R/b/se discussed agreeable with trial)     -Continue PRN's: Tylenol, Maalox, Atarax, Milk of Magnesia   PGY-2 Freida Busman, MD 05/06/2022, 10:38 AM

## 2022-05-06 NOTE — Progress Notes (Signed)
Adult Psychoeducational Group Note  Date:  05/06/2022 Time:  7:44 PM  Group Topic/Focus:  Wrap-Up Group:   The focus of this group is to help patients review their daily goal of treatment and discuss progress on daily workbooks.  Participation Level:  Active  Participation Quality:  Appropriate  Affect:  Appropriate  Cognitive:  Appropriate  Insight: Appropriate  Engagement in Group:  Engaged  Modes of Intervention:  Discussion  Additional Comments:  Pt states she had a good day. Pt expressed enjoying playing games with other patients. Pt states goal is to identify a plan for discharge and how to utilize coping skills after her time here.  Newton Frutiger Katrinka Blazing 05/06/2022, 7:44 PM

## 2022-05-07 LAB — GC/CHLAMYDIA PROBE AMP (~~LOC~~) NOT AT ARMC
Chlamydia: NEGATIVE
Comment: NEGATIVE
Comment: NORMAL
Neisseria Gonorrhea: NEGATIVE

## 2022-05-07 NOTE — Group Note (Signed)
Recreation Therapy Group Note   Group Topic:Stress Management  Group Date: 05/07/2022 Start Time: 0935 End Time: 0950 Facilitators: Victorino Sparrow, LRT,CTRS Location: [address] Hall Dayroom   Goal Area(s) Addresses:  Patient will actively participate in stress management techniques presented during session.  Patient will successfully identify benefit of practicing stress management post d/c.   Group Description: Guided Imagery. LRT provided education, instruction, and demonstration on practice of visualization via guided imagery. Patient was asked to participate in the technique introduced during session. LRT debriefed including topics of mindfulness, stress management and specific scenarios each patient could use these techniques. Patients were given suggestions of ways to access scripts post d/c and encouraged to explore Youtube and other apps available on smartphones, tablets, and computers.   Affect/Mood: N/A   Participation Level: Did not attend    Clinical Observations/Individualized Feedback:     Plan: Continue to engage patient in RT group sessions 2-3x/week.   Victorino Sparrow, LRT,CTRS 05/07/2022 11:07 AM

## 2022-05-07 NOTE — Group Note (Signed)
Occupational Therapy Group Note  Group Topic:Communication  Group Date: 05/07/2022 Start Time: 1400 End Time: 1500 Facilitators: Ted Mcalpine, OT   Group Description: Group encouraged increased engagement and participation through discussion focused on communication styles. Patients were educated on the different styles of communication including passive, aggressive, assertive, and passive-aggressive communication. Group members shared and reflected on which styles they most often find themselves communicating in and brainstormed strategies on how to transition and practice a more assertive approach. Further discussion explored how to use assertiveness skills and strategies to further advocate and ask questions as it relates to their treatment plan and mental health.   Therapeutic Goal(s): Identify practical strategies to improve communication skills  Identify how to use assertive communication skills to address individual needs and wants   Participation Level: Active and Engaged   Participation Quality: Independent   Behavior: Appropriate   Speech/Thought Process: Coherent, Directed, and Focused   Affect/Mood: Appropriate   Insight: Good   Judgement: Good   Individualization: pt was active and engaged in their participation of group discussion/activity. New skills were identified  Modes of Intervention: Discussion and Education  Patient Response to Interventions:  Attentive, Engaged, Interested , and Receptive   Plan: Continue to engage patient in OT groups 2 - 3x/week.  05/07/2022  Ted Mcalpine, OT Kerrin Champagne, OT

## 2022-05-07 NOTE — [facility] Suicide Risk Assessment (Signed)
[facility] INPATIENT:  Family/Significant Other Suicide Prevention Education  Suicide Prevention Education:  Contact Attempts: Cecille Rubin, Mother, [phone]  has been identified by the patient as the family member/significant other with whom the patient will be residing, and identified as the person(s) who will aid the patient in the event of a mental health crisis.  With written consent from the patient, two attempts were made to provide suicide prevention education, prior to and/or following the patient's discharge.  We were unsuccessful in providing suicide prevention education.  A suicide education pamphlet was given to the patient to share with family/significant other.  Date and time of first attempt: 05/07/2022 @ 2:52 PM Date and time of second attempt: CSW will make additional attempts to reach mother.   Corky Crafts 05/07/2022, 2:51 PM

## 2022-05-07 NOTE — Progress Notes (Signed)
D. Pt presents with an improved mood- smiles during interactions. Pt has been visible in the milieu interacting well with peers and staff. Per pt's self inventory, pt rated her depression,hopelessness and anxiety a 1/0/2, respectively. Pt wrote that her goal was "getting home." Pt currently denies pain, and reported that she is tolerating medication well. Pt currently denies SI/HI and AVH   A. Labs and vitals monitored. Pt given and educated on medications. Pt supported emotionally and encouraged to express concerns and ask questions.   R. Pt remains safe with 15 minute checks. Will continue POC.

## 2022-05-07 NOTE — Progress Notes (Signed)
   05/07/22 2200  Psych Admission Type (Psych Patients Only)  Admission Status Voluntary  Psychosocial Assessment  Patient Complaints None  Eye Contact Fair  Facial Expression Animated  Affect Appropriate to circumstance  Speech Logical/coherent  Interaction Assertive  Motor Activity Slow  Appearance/Hygiene Improved  Behavior Characteristics Appropriate to situation;Cooperative  Mood Pleasant  Thought Process  Coherency WDL  Content WDL  Delusions None reported or observed  Perception WDL  Hallucination None reported or observed  Judgment WDL  Confusion None  Danger to Self  Current suicidal ideation? Denies  Danger to Others  Danger to Others None reported or observed

## 2022-05-07 NOTE — Group Note (Signed)
[facility] LCSW Group Therapy Note   Group Date: 05/07/2022 Start Time: 1300 End Time: 1400   Type of Therapy/Topic:  Group Therapy:  Emotion Regulation  Participation Level:  Active   Mood: Euthymic   Description of Group:    The purpose of this group is to assist patients in learning to regulate negative emotions and experience positive emotions. Patients will be guided to discuss ways in which they have been vulnerable to their negative emotions. These vulnerabilities will be juxtaposed with experiences of positive emotions or situations, and patients challenged to use positive emotions to combat negative ones. Special emphasis will be placed on coping with negative emotions in conflict situations, and patients will process healthy conflict resolution skills.  Therapeutic Goals: Patient will identify two positive emotions or experiences to reflect on in order to balance out negative emotions:  Patient will label two or more emotions that they find the most difficult to experience:  Patient will be able to demonstrate positive conflict resolution skills through discussion or role plays:   Summary of Patient Progress:   Patient was present for the entirety of the group session. Patient was an active listener and participated in the topic of discussion, provided helpful advice to others, and added nuance to topic of conversation. Patient identified Panic as an emotions she has difficulty working on, states she tries to practice deep breathing exercises to help subside feelings of anxiety.      Therapeutic Modalities:   Cognitive Behavioral Therapy Feelings Identification Dialectical Behavioral Therapy   Corky Crafts, [place]

## 2022-05-07 NOTE — [facility] Group Notes (Signed)
PT attended AA group and was appropriate and attentive.  

## 2022-05-07 NOTE — Progress Notes (Signed)
[facility] MD Progress Note  05/07/2022 1:26 PM Terry Keith  MRN:  [id number]  Subjective: "I am feeling good. My mother wants  a call from the social worker before discharge."  Reason For Admission:  Terry Keith is a 21 yr old female who presented on 6/1 to [facility] for worsening SI, +AH and depression. She reported hearing voices telling her that she is useless & worthless, and should kill herself.  She was deemed to be a danger to herself , and was transferred voluntarily, and admitted to this [facility] on 6/2.  PPHx is significant for Depression, Anxiety, and ADHD and a remote history of Self-Injurious Behavior (Cutting first and last time years ago).   Today's Patient assessment Note: Patient's chart is reviewed, and her case discussed with her treatment team. Pt is seen today in her room on the 300 hall. She presents with a euthymic mood, and affect is appropriate and congruent. Her attention to personal hygiene and grooming is fair, eye contact is good, speech is clear & coherent. Thought contents are organized and logical, and pt currently denies SI/HI/AVH or paranoia. There is no evidence of delusional thoughts.   Pt reports a good appetite, reports a good sleep quality, states that she is tolerating current medications well, and denies any medication related side effects. She denies being in any physical pain today, and states that her mother is requesting a call from her social worker prior to discharge. She reports that the unit group sessions have been very helpful, and that she has been able to learn coping mechanisms for her depression. Attempts made to discuss partial hospitalization to determine if this will be an option for her after this admission, but she was in group therapy. Her social worker will be notified to discuss this option with her. Discharge will be tomorrow (6/6), if all of her mental health follow up appointments are in place. Will continue all medications as listed below.    Principal Problem: MDD (major depressive disorder), recurrent, severe, with psychosis ([place]) Diagnosis: Principal Problem:   MDD (major depressive disorder), recurrent, severe, with psychosis ([place])  Total Time spent with patient: 20 minutes  Past Psychiatric History: See H&P  Past Medical History:  Past Medical History:  Diagnosis Date   ADHD    Asthma    Attention deficit disorder    Depression    History of placement of ear tubes    ODD (oppositional defiant disorder)     Past Surgical History:  Procedure Laterality Date   MYRINGOTOMY     Family History:  Family History  Problem Relation Age of Onset   Cancer Other    Diabetes Other    Hypertension Other    Thyroid disease Other    Family Psychiatric  History:  See H&P Social History:  Social History   Substance and Sexual Activity  Alcohol Use No   Comment: pt is 21yo     Social History   Substance and Sexual Activity  Drug Use No    Social History   Socioeconomic History   Marital status: Single    Spouse name: Not on file   Number of children: Not on file   Years of education: Not on file   Highest education level: Not on file  Occupational History   Not on file  Tobacco Use   Smoking status: Never    Passive exposure: Yes   Smokeless tobacco: Never  Substance and Sexual Activity   Alcohol use:  No    Comment: pt is 21yo   Drug use: No   Sexual activity: Never  Other Topics Concern   Not on file  Social History Narrative   Not on file   Social Determinants of Health   Financial Resource Strain: Not on file  Food Insecurity: Not on file  Transportation Needs: Not on file  Physical Activity: Not on file  Stress: Not on file  Social Connections: Not on file   Additional Social History:    Pain Medications: SEE MAR Prescriptions: SEE MAR Over the Counter: SEE MAR History of alcohol / drug use?: No history of alcohol / drug abuse    Sleep: Good  Appetite:  Fair  Current  Medications: Current Facility-Administered Medications  Medication Dose Route Frequency Provider Last Rate Last Admin   acetaminophen (TYLENOL) tablet 650 mg  650 mg Oral Q6H PRN Ntuen, Kris Hartmann, FNP       albuterol (VENTOLIN HFA) 108 (90 Base) MCG/ACT inhaler 2 puff  2 puff Inhalation Q4H PRN Bobbitt, Shalon E, NP   2 puff at 05/06/22 2048   alum & mag hydroxide-simeth (MAALOX/MYLANTA) 200-200-20 MG/5ML suspension 30 mL  30 mL Oral Q4H PRN Ntuen, Kris Hartmann, FNP       FLUoxetine (PROZAC) capsule 10 mg  10 mg Oral Daily Briant Cedar, MD   10 mg at 05/07/22 0800   hydrOXYzine (ATARAX) tablet 25 mg  25 mg Oral TID PRN Ntuen, Kris Hartmann, FNP       magnesium hydroxide (MILK OF MAGNESIA) suspension 30 mL  30 mL Oral Daily PRN Ntuen, Kris Hartmann, FNP       QUEtiapine (SEROQUEL) tablet 50 mg  50 mg Oral QHS Briant Cedar, MD   50 mg at 05/06/22 2113   Lab Results:  No results found for this or any previous visit (from the past 48 hour(s)).  Blood Alcohol level:  Lab Results  Component Value Date   [facility] <11 [id number]   Metabolic Disorder Labs: Lab Results  Component Value Date   HGBA1C 5.0 05/04/2022   MPG 96.8 05/04/2022   Lab Results  Component Value Date   PROLACTIN 28.5 (H) 05/04/2022   Lab Results  Component Value Date   CHOL 145 05/05/2022   TRIG 55 05/05/2022   HDL 44 05/05/2022   CHOLHDL 3.3 05/05/2022   VLDL 11 05/05/2022   LDLCALC 90 05/05/2022   Physical Findings: AIMS: Facial and Oral Movements Muscles of Facial Expression: None, normal Lips and Perioral Area: None, normal Jaw: None, normal Tongue: None, normal,Extremity Movements Upper (arms, wrists, hands, fingers): None, normal Lower (legs, knees, ankles, toes): None, normal, Trunk Movements Neck, shoulders, hips: None, normal, Overall Severity Severity of abnormal movements (highest score from questions above): None, normal Incapacitation due to abnormal movements: None, normal Patient's awareness of  abnormal movements (rate only patient's report): No Awareness, Dental Status Current problems with teeth and/or dentures?: No Does patient usually wear dentures?: No  CIWA:   n/a COWS:  n/a   Musculoskeletal: Strength & Muscle Tone: within normal limits Gait & Station: normal Patient leans: N/A  Psychiatric Specialty Exam:  Presentation  General Appearance: Appropriate for Environment; Fairly Groomed  Eye Contact:Good  Speech:Clear and Coherent  Speech Volume:Normal  Handedness:Right  Mood and Affect  Mood:Euthymic  Affect:Appropriate  Thought Process  Thought Processes:Coherent  Descriptions of Associations:Intact  Orientation:Full (Time, Place and Person)  Thought Content:Logical  History of Schizophrenia/Schizoaffective disorder:No  Duration of Psychotic Symptoms:No data recorded Hallucinations:Hallucinations:  None  Ideas of Reference:None  Suicidal Thoughts:Suicidal Thoughts: No  Homicidal Thoughts:Homicidal Thoughts: No  Sensorium  Memory:Immediate Good  Judgment:Good  Insight:Good  Executive Functions  Concentration:Good  Attention Span:Good  [place] of Knowledge:Good  Language:Good  Psychomotor Activity  Psychomotor Activity:Psychomotor Activity: Normal  Assets  Assets:Communication Skills  Sleep  Sleep:Sleep: Good  Physical Exam: Physical Exam HENT:     Head: Normocephalic and atraumatic.  Pulmonary:     Effort: Pulmonary effort is normal.  Neurological:     Mental Status: She is alert. She is disoriented.   Review of Systems  Gastrointestinal:  Negative for diarrhea.  Psychiatric/Behavioral:  Positive for depression (improving with current medications). Negative for hallucinations and suicidal ideas. The patient has insomnia (improving with medications).   Blood pressure 104/66, pulse 80, temperature 98 F (36.7 C), temperature source Oral, resp. rate 15, height 5\' 9"  (1.753 m), weight 79.4 kg, SpO2 100 %.  Body mass index is 25.84 kg/m.  Treatment Plan Summary: Daily contact with patient to assess and evaluate symptoms and progress in treatment and Medication management  PLAN Safety and Monitoring: Voluntary admission to inpatient psychiatric unit for safety, stabilization and treatment Daily contact with patient to assess and evaluate symptoms and progress in treatment Patient's case to be discussed in multi-disciplinary team meeting Observation Level : q15 minute checks Vital signs: q12 hours Precautions: Safety  Labs: TSH- WNL, A1c- WNL, CMP- WNL, CBC- WNL, Lipid- WNL, Prolactin - 28.5   MDD, Recurrent, Severe, w/ Psychosis: -Continue Prozac 10 mg daily for depression (R/b/se discussed agreeable with trial) -Continue Seroquel 50 mg QHS for augmentation and psychosis (R/b/se discussed agreeable with trial)    -Continue PRN's: Tylenol, Maalox, Atarax, Milk of Magnesia  Discharge Planning: Social work and case management to assist with discharge planning and identification of hospital follow-up needs prior to discharge Estimated LOS: 5-7 days Discharge Concerns: Need to establish a safety plan; Medication compliance and effectiveness Discharge Goals: Return home with outpatient referrals for mental health follow-up including medication management/psychotherapy  Nicholes Rough, NP 05/07/2022, 1:26 PM Patient ID: Terry Keith, female   DOB: 2001/09/26, 21 y.o.   MRN: [id number]

## 2022-05-07 NOTE — [facility] Group Notes (Signed)
  Spiritual care group on grief and loss facilitated by chaplain Dyanne Carrel, [facility]   Group Goal:   Support / Education around grief and loss   Members engage in facilitated group support and psycho-social education.   Group Description:   Following introductions and group rules, group members engaged in facilitated group dialog and support around topic of loss, with particular support around experiences of loss in their lives. Group Identified types of loss (relationships / self / things) and identified patterns, circumstances, and changes that precipitate losses. Reflected on thoughts / feelings around loss, normalized grief responses, and recognized variety in grief experience. Group noted Worden's four tasks of grief in discussion.   Group drew on Adlerian / Rogerian, narrative, [place],   Patient Progress: Terry Keith attended group and actively participated and engaged in conversation.  She shared about the loss of her grandmother and her dog, Rosie.  She was self-reflective and able to share some of her coping strategies with the group.    [address] Bcc Pager, [phone]

## 2022-05-08 ENCOUNTER — Encounter (HOSPITAL_COMMUNITY): Payer: Self-pay | Admitting: Psychiatry

## 2022-05-08 DIAGNOSIS — F333 Major depressive disorder, recurrent, severe with psychotic symptoms: Principal | ICD-10-CM

## 2022-05-08 MED ORDER — HYDROXYZINE HCL 25 MG PO TABS
25.0000 mg | ORAL_TABLET | Freq: Four times a day (QID) | ORAL | Status: DC | PRN
  Filled 2022-05-08: qty 10

## 2022-05-08 MED ORDER — HYDROXYZINE HCL 25 MG PO TABS: 25.0000 mg | ORAL_TABLET | Freq: Four times a day (QID) | ORAL | 0 refills | Status: AC | PRN

## 2022-05-08 MED ORDER — FLUOXETINE HCL 10 MG PO CAPS: 10.0000 mg | ORAL_CAPSULE | Freq: Every day | ORAL | 0 refills | Status: AC

## 2022-05-08 MED ORDER — QUETIAPINE FUMARATE 50 MG PO TABS: 50.0000 mg | ORAL_TABLET | Freq: Every day | ORAL | 0 refills | Status: AC

## 2022-05-08 NOTE — Plan of Care (Signed)
Nurse discussed coping skills with patient.  

## 2022-05-08 NOTE — Discharge Summary (Signed)
Physician Discharge Summary Note  Patient:  Terry Keith is an 21 y.o., female MRN:  [id number] DOB:  2001-02-22 Patient phone:  [phone] (home)  Patient address:   [address] [address],  Total Time spent with patient: 30 minutes  Date of Admission:  05/03/2022 Date of Discharge: 05/08/2022  Reason for Admission:    Terry Keith is a 21 yr old female who presented on 6/1 to [facility] for worsening SI, +AH and depression. She reported hearing voices telling her that she is useless & worthless, and should kill herself.  She was deemed to be a danger to herself , and was transferred voluntarily, and admitted to this [facility] on 6/2.  PPHx is significant for Depression, Anxiety, and ADHD and a remote history of Self-Injurious Behavior (Cutting first and last time years ago).  Principal Problem: MDD (major depressive disorder), recurrent, severe, with psychosis ([place]) Discharge Diagnoses: Principal Problem:   MDD (major depressive disorder), recurrent, severe, with psychosis ([place])  Past Psychiatric History: As above  Past Medical History:  Past Medical History:  Diagnosis Date   ADHD    Asthma    Attention deficit disorder    Depression    History of placement of ear tubes    ODD (oppositional defiant disorder)     Past Surgical History:  Procedure Laterality Date   MYRINGOTOMY     Family History:  Family History  Problem Relation Age of Onset   Cancer Other    Diabetes Other    Hypertension Other    Thyroid disease Other    Family Psychiatric  History: As above Social History:  Social History   Substance and Sexual Activity  Alcohol Use No     Social History   Substance and Sexual Activity  Drug Use No    Social History   Socioeconomic History   Marital status: Single    Spouse name: Not on file   Number of children: Not on file   Years of education: Not on file   Highest education level: Not on file  Occupational History    Not on file  Tobacco Use   Smoking status: Never    Passive exposure: Yes   Smokeless tobacco: Never  Substance and Sexual Activity   Alcohol use: No   Drug use: No   Sexual activity: Never  Other Topics Concern   Not on file  Social History Narrative   Not on file   Social Determinants of Health   Financial Resource Strain: Not on file  Food Insecurity: Not on file  Transportation Needs: Not on file  Physical Activity: Not on file  Stress: Not on file  Social Connections: Not on file                                    HOSPITAL COURSE During the patient's hospitalization, patient had extensive initial psychiatric evaluation, and follow-up psychiatric evaluations every day. Psychiatric diagnoses provided upon initial assessment were MDD, anxiety and insomnia.  Patient's psychiatric medications were adjusted on admission as follows: -Stopped Lexapro -Started Prozac 10 mg daily for depression (R/b/se discussed agreeable with trial) -Started Seroquel 50 mg QHS for augmentation and psychosis (R/b/se discussed agreeable with trial) -Started Hydroxyzine 25 mg Q 6 H PRN for anxiety   During the hospitalization, no other adjustments were made to the patient's psychiatric medication regimen, and pt  is being discharged home with medications as listed above.   Patient's care was discussed during the interdisciplinary team meeting every day during the hospitalization. The patient denies having side effects to prescribed psychiatric medication. The patient was evaluated each day by a clinical provider to ascertain response to treatment. Improvement was noted by the patient's report of decreasing symptoms, improved sleep and appetite, affect, medication tolerance, behavior, and participation in unit programming.  Patient was asked each day to complete a self inventory noting mood, mental status, pain, new symptoms, anxiety and concerns.     Symptoms were reported as significantly decreased or  resolved completely by discharge. On day of discharge, the patient reports that their mood is stable. The patient denied having suicidal thoughts for more than 48 hours prior to discharge.  Patient denies having homicidal thoughts.  Patient denies having auditory hallucinations.  Patient denies any visual hallucinations or other symptoms of psychosis. The patient was motivated to continue taking medication with a goal of continued improvement in mental health.    The patient reports their target psychiatric symptoms of depression, anxiety & insomnia responded well to the psychiatric medications, and the patient reports overall benefit from this psychiatric hospitalization. Supportive psychotherapy was provided to the patient. The patient also participated in regular group therapy while hospitalized. Coping skills, problem solving as well as relaxation therapies were also part of the unit programming.   Labs were reviewed with the patient, and abnormal results were discussed with the patient. Prolactin level is elevated at 28.5. Pt educated to follow up with her primary care provider after discharge.   Physical Findings: AIMS: Facial and Oral Movements Muscles of Facial Expression: None, normal Lips and Perioral Area: None, normal Jaw: None, normal Tongue: None, normal,Extremity Movements Upper (arms, wrists, hands, fingers): None, normal Lower (legs, knees, ankles, toes): None, normal, Trunk Movements Neck, shoulders, hips: None, normal, Overall Severity Severity of abnormal movements (highest score from questions above): None, normal Incapacitation due to abnormal movements: None, normal Patient's awareness of abnormal movements (rate only patient's report): No Awareness, Dental Status Current problems with teeth and/or dentures?: No Does patient usually wear dentures?: No  CIWA: n/a    COWS:  n/a AIMS:0   Musculoskeletal: Strength & Muscle Tone: within normal limits Gait & Station:  normal Patient leans: N/A  Psychiatric Specialty Exam:  Presentation  General Appearance: Appropriate for Environment; Fairly Groomed  Eye Contact:Good  Speech:Clear and Coherent  Speech Volume:Normal  Handedness:Right  Mood and Affect  Mood:Euthymic  Affect:Appropriate  Thought Process  Thought Processes:Coherent  Descriptions of Associations:Intact  Orientation:Full (Time, Place and Person)  Thought Content:Logical  History of Schizophrenia/Schizoaffective disorder:No  Duration of Psychotic Symptoms:No data recorded Hallucinations:Hallucinations: None  Ideas of Reference:None  Suicidal Thoughts:Suicidal Thoughts: No  Homicidal Thoughts:Homicidal Thoughts: No  Sensorium  Memory:Immediate Good  Judgment:Good  Insight:Good  Executive Functions  Concentration:Good  Attention Span:Good  Recall:Good  Fund of Knowledge:Good  Language:Good  Psychomotor Activity  Psychomotor Activity:Psychomotor Activity: Normal  Assets  Assets:Communication Skills; Housing; Social Support  Sleep  Sleep:Sleep: Good  Physical Exam: Physical Exam Constitutional:      Appearance: Normal appearance.  HENT:     Head: Normocephalic.  Eyes:     Pupils: Pupils are equal, round, and reactive to light.  Pulmonary:     Effort: Pulmonary effort is normal.  Musculoskeletal:        General: Normal range of motion.     Cervical back: Normal range of motion.  Neurological:  Mental Status: She is alert and oriented to person, place, and time.  Psychiatric:        Behavior: Behavior normal.        Thought Content: Thought content normal.   Review of Systems  Constitutional: Negative.  Negative for fever.  HENT: Negative.    Eyes: Negative.   Respiratory: Negative.    Cardiovascular: Negative.   Gastrointestinal: Negative.   Genitourinary: Negative.   Musculoskeletal: Negative.   Skin: Negative.   Neurological: Negative.   Psychiatric/Behavioral:  Positive  for depression (Pt reports that her depressive symptoms are resolving. She currently denies SI/HI/AVH and verbally contracts for safety outside of this [facility]). Negative for hallucinations, substance abuse and suicidal ideas. Nervous/anxious: anxiety is improving with medications. Insomnia: insomnia is improving with medications.   Blood pressure 123/67, pulse 69, temperature 98.2 F (36.8 C), temperature source Oral, resp. rate 17, height 5\' 9"  (1.753 m), weight 79.4 kg, SpO2 98 %. Body mass index is 25.84 kg/m.  Social History   Tobacco Use  Smoking Status Never   Passive exposure: Yes  Smokeless Tobacco Never   Tobacco Cessation:  N/A, patient does not currently use tobacco products  Blood Alcohol level:  Lab Results  Component Value Date   [facility] <11 [id number]   Metabolic Disorder Labs:  Lab Results  Component Value Date   HGBA1C 5.0 05/04/2022   MPG 96.8 05/04/2022   Lab Results  Component Value Date   PROLACTIN 28.5 (H) 05/04/2022   Lab Results  Component Value Date   CHOL 145 05/05/2022   TRIG 55 05/05/2022   HDL 44 05/05/2022   CHOLHDL 3.3 05/05/2022   VLDL 11 05/05/2022   [place] 90 05/05/2022   See Psychiatric Specialty Exam and Suicide Risk Assessment completed by Attending Physician prior to discharge.  Discharge destination:  Home  Is patient on multiple antipsychotic therapies at discharge:  No   Has Patient had three or more failed trials of antipsychotic monotherapy by history:  No  Recommended Plan for Multiple Antipsychotic Therapies: NA  Allergies as of 05/08/2022   No Known Allergies      Medication List     STOP taking these medications    escitalopram 10 MG tablet Commonly known as: LEXAPRO       TAKE these medications      Indication  albuterol 108 (90 Base) MCG/ACT inhaler Commonly known as: VENTOLIN HFA Inhale 2 puffs into the lungs every 4 (four) hours as needed for wheezing or shortness of breath.  Indication: Asthma    FLUoxetine 10 MG capsule Commonly known as: PROZAC Take 1 capsule (10 mg total) by mouth daily. Start taking on: May 09, 2022  Indication: Depression   hydrOXYzine 25 MG tablet Commonly known as: ATARAX Take 1 tablet (25 mg total) by mouth every 6 (six) hours as needed for anxiety.  Indication: Feeling Anxious   QUEtiapine 50 MG tablet Commonly known as: SEROQUEL Take 1 tablet (50 mg total) by mouth at bedtime.  Indication: insomnia        Follow-up Information     [facility]. Go on 05/11/2022.   Why: You have an appointment with Mitzi Hansen for therapy services on 05/11/22 at 1:00 pm (in person).  You also have an appointment for medication management services on 05/30/22 at 2:00 pm (in person). Contact information: [address], [place] [address]  Phone: [phone] Fax:  [phone]  Follow-up recommendations:   The patient is able to verbalize their individual safety plan to this provider.   # It is recommended to the patient to continue psychiatric medications as prescribed, after discharge from the hospital.     # It is recommended to the patient to follow up with your outpatient psychiatric provider and PCP.   # It was discussed with the patient, the impact of alcohol, drugs, tobacco have been there overall psychiatric and medical wellbeing, and total abstinence from substance use was recommended the patient.ed.   # Prescriptions provided or sent directly to preferred pharmacy at discharge. Patient agreeable to plan. Given opportunity to ask questions. Appears to feel comfortable with discharge.    # In the event of worsening symptoms, the patient is instructed to call the crisis hotline (988), 911 and or go to the nearest ED for appropriate evaluation and treatment of symptoms. To follow-up with primary care provider for other medical issues, concerns and or health care needs   # Patient was discharged home with  mother with a plan to follow up as noted above.      Signed: Nicholes Rough, NP 05/08/2022, 11:06 AM

## 2022-05-08 NOTE — Progress Notes (Signed)
  [facility] Adult Case Management Discharge Plan :  Will you be returning to the same living situation after discharge:  Yes,  Patient to return to mother's residence.  At discharge, do you have transportation home?: Yes,  Mother to assist with transportation from hospital.  Do you have the ability to pay for your medications: Yes,  [place] Medicaid.   Release of information consent forms completed and in the chart;  Patient's signature needed at discharge.  Patient to Follow up at:  Follow-up [facility]. Go on 05/11/2022.   Why: You have an appointment with Mitzi Hansen for therapy services on 05/11/22 at 1:00 pm (in person).  You also have an appointment for medication management services on 05/30/22 at 2:00 pm (in person). Contact information: [address], [place] [address]  Phone: [phone] Fax:  [phone]                Next level of care provider has access to [place] and Suicide Prevention discussed: Yes,  SPE completed with mother Harlene Ramus.  Patient's support agrees to monitor patient for safety, ensure treatment compliance, and alert emergency services should patient require such services.     Has patient been referred to the Quitline?: N/A patient is not a smoker Tobacco Use: Medium Risk   Smoking Tobacco Use: Never   Smokeless Tobacco Use: Never   Passive Exposure: Yes    Patient has been referred for addiction treatment: N/A  Alcohol Level    Component Value Date/Time   [facility] <11 08/04/2014 0815   Social History   Substance and Sexual Activity  Alcohol Use No    Social History   Substance and Sexual Activity  Drug Use No   Durenda Hurt, LCSWA 05/08/2022, 9:06 AM

## 2022-05-08 NOTE — Progress Notes (Signed)
Pt discharged to lobby. Pt was stable and appreciative at that time. All papers, samples and prescriptions were given and valuables returned. Verbal understanding expressed. Denies SI/HI and A/VH. Pt given opportunity to express concerns and ask questions.  

## 2022-05-08 NOTE — BHH Suicide Risk Assessment (Cosign Needed)
Suicide Risk Assessment  Discharge Assessment    [facility] Discharge Suicide Risk Assessment   Principal Problem: MDD (major depressive disorder), recurrent, severe, with psychosis ([place]) Discharge Diagnoses: Principal Problem:   MDD (major depressive disorder), recurrent, severe, with psychosis ([place])  Reason For Admission:  Terry Keith is a 21 yr old female who presented on 6/1 to [facility] for worsening SI, +AH and depression. She reported hearing voices telling her that she is useless & worthless, and should kill herself.  She was deemed to be a danger to herself , and was transferred voluntarily, and admitted to this [facility] on 6/2.  PPHx is significant for Depression, Anxiety, and ADHD and a remote history of Self-Injurious Behavior (Cutting first and last time years ago).                                  HOSPITAL COURSE During the patient's hospitalization, patient had extensive initial psychiatric evaluation, and follow-up psychiatric evaluations every day. Psychiatric diagnoses provided upon initial assessment were MDD, anxiety and insomnia.  Patient's psychiatric medications were adjusted on admission as follows: -Stopped Lexapro -Started Prozac 10 mg daily for depression (R/b/se discussed agreeable with trial) -Started Seroquel 50 mg QHS for augmentation and psychosis (R/b/se discussed agreeable with trial) -Started Hydroxyzine 25 mg Q 6 H PRN for anxiety  During the hospitalization, no other adjustments were made to the patient's psychiatric medication regimen, and pt is being discharged home with medications as listed above.  Patient's care was discussed during the interdisciplinary team meeting every day during the hospitalization. The patient denies having side effects to prescribed psychiatric medication. The patient was evaluated each day by a clinical provider to ascertain response to treatment. Improvement was noted by the patient's report of decreasing symptoms, improved sleep and  appetite, affect, medication tolerance, behavior, and participation in unit programming.  Patient was asked each day to complete a self inventory noting mood, mental status, pain, new symptoms, anxiety and concerns.    Symptoms were reported as significantly decreased or resolved completely by discharge. On day of discharge, the patient reports that their mood is stable. The patient denied having suicidal thoughts for more than 48 hours prior to discharge.  Patient denies having homicidal thoughts.  Patient denies having auditory hallucinations.  Patient denies any visual hallucinations or other symptoms of psychosis. The patient was motivated to continue taking medication with a goal of continued improvement in mental health.   The patient reports their target psychiatric symptoms of depression, anxiety & insomnia responded well to the psychiatric medications, and the patient reports overall benefit from this psychiatric hospitalization. Supportive psychotherapy was provided to the patient. The patient also participated in regular group therapy while hospitalized. Coping skills, problem solving as well as relaxation therapies were also part of the unit programming.  Labs were reviewed with the patient, and abnormal results were discussed with the patient. Prolactin level is elevated at 28.5. Pt educated to follow up with her primary care provider after discharge.  Total Time spent with patient: 30 minutes  Musculoskeletal: Strength & Muscle Tone: within normal limits Gait & Station: normal Patient leans: N/A  Psychiatric Specialty Exam  Presentation  General Appearance: Appropriate for Environment; Fairly Groomed  Eye Contact:Good  Speech:Clear and Coherent  Speech Volume:Normal  Handedness:Right  Mood and Affect  Mood:Euthymic  Duration of Depression Symptoms: Greater than two weeks  Affect:Appropriate  Thought Process  Thought Processes:Coherent  Descriptions of  Associations:Intact  Orientation:Full (Time, Place and Person)  Thought Content:Logical  History of Schizophrenia/Schizoaffective disorder:No  Duration of Psychotic Symptoms:No data recorded Hallucinations:Hallucinations: None  Ideas of Reference:None  Suicidal Thoughts:Suicidal Thoughts: No  Homicidal Thoughts:Homicidal Thoughts: No  Sensorium  Memory:Immediate Good  Judgment:Good  Insight:Good  Executive Functions  Concentration:Good  Attention Span:Good  Grandfather of Knowledge:Good  Language:Good  Psychomotor Activity  Psychomotor Activity:Psychomotor Activity: Normal  Assets  Assets:Communication Skills; Housing; Social Support  Sleep  Sleep:Sleep: Good  Physical Exam: Physical Exam Constitutional:      Appearance: Normal appearance.  HENT:     Head: Normocephalic.     Nose: Nose normal. No congestion or rhinorrhea.  Eyes:     Pupils: Pupils are equal, round, and reactive to light.  Pulmonary:     Effort: Pulmonary effort is normal.  Musculoskeletal:        General: Normal range of motion.     Cervical back: Normal range of motion.  Neurological:     General: No focal deficit present.     Mental Status: She is alert and oriented to person, place, and time.     Sensory: No sensory deficit.     Coordination: Coordination normal.  Psychiatric:        Behavior: Behavior normal.        Thought Content: Thought content normal.   Review of Systems  Constitutional: Negative.  Negative for fever.  HENT: Negative.  Negative for sore throat.   Eyes: Negative.   Respiratory: Negative.    Cardiovascular: Negative.   Gastrointestinal: Negative.   Genitourinary: Negative.   Musculoskeletal: Negative.   Skin: Negative.   Neurological: Negative.   Psychiatric/Behavioral:  Positive for depression (Depressive symptoms are improving on current medications, and pt denies SI/HI/AVH and verbally contracts for safety outside of this [facility]).  Negative for hallucinations, substance abuse and suicidal ideas. Nervous/anxious: anxiety is improving with medications. Insomnia: insomnia is improving with medications.   Blood pressure 123/67, pulse 69, temperature 98.2 F (36.8 C), temperature source Oral, resp. rate 17, height 5\' 9"  (1.753 m), weight 79.4 kg, SpO2 98 %. Body mass index is 25.84 kg/m.  Mental Status Per Nursing Assessment::   On Admission:  Self-harm thoughts  Demographic Factors:  Adolescent or young adult  Loss Factors: NA  Historical Factors: NA  Risk Reduction Factors:   Sense of responsibility to family, Living with another person, especially a relative, and Positive social support  Continued Clinical Symptoms:  Patient reports that her depressive symptoms are resolving. She denies SI/HI/AVH and verbally contracts for safety on the unit.  Cognitive Features That Contribute To Risk:  None    Suicide Risk:  Minimal: No identifiable suicidal ideation.  Patients presenting with no risk factors but with morbid ruminations; may be classified as minimal risk based on the severity of the depressive symptoms   Follow-up [place]. Go on 05/11/2022.   Why: You have an appointment with Mitzi Hansen for therapy services on 05/11/22 at 1:00 pm (in person).  You also have an appointment for medication management services on 05/30/22 at 2:00 pm (in person). Contact information: [address], [place] [address]  Phone: [phone] Fax:  [phone]               Plan Of Care/Follow-up recommendations:  The patient is able to verbalize their individual safety plan to this provider.  # It is recommended to the patient  to continue psychiatric medications as prescribed, after discharge from the hospital.    # It is recommended to the patient to follow up with your outpatient psychiatric provider and PCP.  # It was discussed with the patient, the impact of alcohol,  drugs, tobacco have been there overall psychiatric and medical wellbeing, and total abstinence from substance use was recommended the patient.ed.  # Prescriptions provided or sent directly to preferred pharmacy at discharge. Patient agreeable to plan. Given opportunity to ask questions. Appears to feel comfortable with discharge.    # In the event of worsening symptoms, the patient is instructed to call the crisis hotline (988), 911 and or go to the nearest ED for appropriate evaluation and treatment of symptoms. To follow-up with primary care provider for other medical issues, concerns and or health care needs  # Patient was discharged home with mother with a plan to follow up as noted above.   Nicholes Rough, NP 05/08/2022, 11:00 AM

## 2022-05-08 NOTE — Progress Notes (Signed)
D:  Patient denied SI and HI, contracts for safety.  Denied A/V hallucinations.  Denied pain. A:  Medications administered per MD orders.  Emotional support and encouragement given patient. R:  Safety maintained with 15 minute checks. Patient stated she slept 8 hours last night.  Looking forward to discharge today.

## 2022-05-08 NOTE — [facility] Suicide Risk Assessment (Signed)
[facility] INPATIENT:  Family/Significant Other Suicide Prevention Education  Suicide Prevention Education:  Education Completed; Cecille Rubin, Mother, [phone] has been identified by the patient as the family member/significant other with whom the patient will be residing, and identified as the person(s) who will aid the patient in the event of a mental health crisis (suicidal ideations/suicide attempt).  With written consent from the patient, the family member/significant other has been provided the following suicide prevention education, prior to the and/or following the discharge of the patient.  In addition to the precautions below, patient's support agrees to monitor patient for safety, ensure treatment compliance, and alert emergency services should patient require such services. Mother states she will take her to her appointments after discharge.  The suicide prevention education provided includes the following: Suicide risk factors Suicide prevention and interventions National Suicide Hotline telephone number [facility] assessment telephone number [facility] Emergency Assistance 911 [facility] and/or Residential Mobile Crisis Unit telephone number  Request made of family/significant other to: Remove weapons (e.g., guns, rifles, knives), all items previously/currently identified as safety concern.   Remove drugs/medications (over-the-counter, prescriptions, illicit drugs), all items previously/currently identified as a safety concern.  The family member/significant other verbalizes understanding of the suicide prevention education information provided.  The family member/significant other agrees to remove the items of safety concern listed above.  Corky Crafts 05/08/2022, 9:05 AM
